# Patient Record
Sex: Female | Born: 2005 | Race: White | Hispanic: No | Marital: Single | Smoking: Never smoker
Health system: Southern US, Community
[De-identification: ages and names within clinical notes are randomized; demographics above are authoritative.]

## PROBLEM LIST (undated history)

## (undated) DIAGNOSIS — J989 Respiratory disorder, unspecified: Secondary | ICD-10-CM

## (undated) DIAGNOSIS — F419 Anxiety disorder, unspecified: Secondary | ICD-10-CM

## (undated) DIAGNOSIS — I471 Supraventricular tachycardia: Secondary | ICD-10-CM

## (undated) DIAGNOSIS — F32A Depression, unspecified: Secondary | ICD-10-CM

## (undated) HISTORY — PX: TYMPANOSTOMY TUBE PLACEMENT: SHX32

## (undated) HISTORY — DX: Depression, unspecified: F32.A

## (undated) HISTORY — DX: Anxiety disorder, unspecified: F41.9

---
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
## 2023-06-11 DIAGNOSIS — F4323 Adjustment disorder with mixed anxiety and depressed mood: Secondary | ICD-10-CM | POA: Diagnosis not present

## 2023-10-30 DIAGNOSIS — L732 Hidradenitis suppurativa: Secondary | ICD-10-CM | POA: Diagnosis not present

## 2023-10-30 DIAGNOSIS — L858 Other specified epidermal thickening: Secondary | ICD-10-CM | POA: Diagnosis not present

## 2023-10-30 DIAGNOSIS — E221 Hyperprolactinemia: Secondary | ICD-10-CM | POA: Diagnosis not present

## 2023-11-05 DIAGNOSIS — F4323 Adjustment disorder with mixed anxiety and depressed mood: Secondary | ICD-10-CM | POA: Diagnosis not present

## 2023-11-23 DIAGNOSIS — F4323 Adjustment disorder with mixed anxiety and depressed mood: Secondary | ICD-10-CM | POA: Diagnosis not present

## 2023-12-03 DIAGNOSIS — F4323 Adjustment disorder with mixed anxiety and depressed mood: Secondary | ICD-10-CM | POA: Diagnosis not present

## 2023-12-06 DIAGNOSIS — M9902 Segmental and somatic dysfunction of thoracic region: Secondary | ICD-10-CM | POA: Diagnosis not present

## 2023-12-06 DIAGNOSIS — M9903 Segmental and somatic dysfunction of lumbar region: Secondary | ICD-10-CM | POA: Diagnosis not present

## 2023-12-06 DIAGNOSIS — M5451 Vertebrogenic low back pain: Secondary | ICD-10-CM | POA: Diagnosis not present

## 2023-12-06 DIAGNOSIS — M9905 Segmental and somatic dysfunction of pelvic region: Secondary | ICD-10-CM | POA: Diagnosis not present

## 2023-12-10 DIAGNOSIS — M5451 Vertebrogenic low back pain: Secondary | ICD-10-CM | POA: Diagnosis not present

## 2023-12-10 DIAGNOSIS — M9903 Segmental and somatic dysfunction of lumbar region: Secondary | ICD-10-CM | POA: Diagnosis not present

## 2023-12-10 DIAGNOSIS — M9905 Segmental and somatic dysfunction of pelvic region: Secondary | ICD-10-CM | POA: Diagnosis not present

## 2023-12-10 DIAGNOSIS — M9902 Segmental and somatic dysfunction of thoracic region: Secondary | ICD-10-CM | POA: Diagnosis not present

## 2023-12-11 DIAGNOSIS — F419 Anxiety disorder, unspecified: Secondary | ICD-10-CM | POA: Diagnosis not present

## 2023-12-11 DIAGNOSIS — L709 Acne, unspecified: Secondary | ICD-10-CM | POA: Diagnosis not present

## 2023-12-11 DIAGNOSIS — E288 Other ovarian dysfunction: Secondary | ICD-10-CM | POA: Diagnosis not present

## 2023-12-11 DIAGNOSIS — E221 Hyperprolactinemia: Secondary | ICD-10-CM | POA: Diagnosis not present

## 2023-12-21 DIAGNOSIS — F4323 Adjustment disorder with mixed anxiety and depressed mood: Secondary | ICD-10-CM | POA: Diagnosis not present

## 2023-12-24 DIAGNOSIS — M9903 Segmental and somatic dysfunction of lumbar region: Secondary | ICD-10-CM | POA: Diagnosis not present

## 2023-12-24 DIAGNOSIS — M5451 Vertebrogenic low back pain: Secondary | ICD-10-CM | POA: Diagnosis not present

## 2023-12-24 DIAGNOSIS — M9902 Segmental and somatic dysfunction of thoracic region: Secondary | ICD-10-CM | POA: Diagnosis not present

## 2023-12-24 DIAGNOSIS — M9905 Segmental and somatic dysfunction of pelvic region: Secondary | ICD-10-CM | POA: Diagnosis not present

## 2023-12-31 DIAGNOSIS — F4323 Adjustment disorder with mixed anxiety and depressed mood: Secondary | ICD-10-CM | POA: Diagnosis not present

## 2024-01-09 DIAGNOSIS — M5451 Vertebrogenic low back pain: Secondary | ICD-10-CM | POA: Diagnosis not present

## 2024-01-09 DIAGNOSIS — M9905 Segmental and somatic dysfunction of pelvic region: Secondary | ICD-10-CM | POA: Diagnosis not present

## 2024-01-09 DIAGNOSIS — M9902 Segmental and somatic dysfunction of thoracic region: Secondary | ICD-10-CM | POA: Diagnosis not present

## 2024-01-09 DIAGNOSIS — M9903 Segmental and somatic dysfunction of lumbar region: Secondary | ICD-10-CM | POA: Diagnosis not present
